# Patient Record
Sex: Female | Born: 2000 | Race: Black or African American | Hispanic: No | Marital: Single | Smoking: Never smoker
Health system: Southern US, Community
[De-identification: ages and names within clinical notes are randomized; demographics above are authoritative.]

## PROBLEM LIST (undated history)

## (undated) DIAGNOSIS — Z789 Other specified health status: Secondary | ICD-10-CM

## (undated) SURGICAL SUPPLY — 53 items
BIT DRILL CANN FLEX 14 (BIT) ×2
BLADE SURG 10 STRL SS (BLADE) ×4
BNDG COHESIVE 4X5 TAN STRL (GAUZE/BANDAGES/DRESSINGS) ×2
BNDG COHESIVE 6X5 TAN STRL LF (GAUZE/BANDAGES/DRESSINGS)
BNDG ELASTIC 4X5.8 VLCR STR LF (GAUZE/BANDAGES/DRESSINGS) ×2
BNDG ELASTIC 6X5.8 VLCR STR LF (GAUZE/BANDAGES/DRESSINGS) ×2
BRUSH SCRUB EZ PLAIN DRY (MISCELLANEOUS) ×4
CHLORAPREP W/TINT 26 (MISCELLANEOUS) ×2
COVER SURGICAL LIGHT HANDLE (MISCELLANEOUS) ×2
COVER WAND RF STERILE (DRAPES) ×2
DERMABOND ADVANCED (GAUZE/BANDAGES/DRESSINGS) ×2
DRAPE C-ARM 35X43 STRL (DRAPES) ×2
DRAPE C-ARMOR (DRAPES) ×2
DRAPE HALF SHEET 40X57 (DRAPES) ×4
DRAPE IMP U-DRAPE 54X76 (DRAPES) ×4
DRAPE INCISE IOBAN 66X45 STRL (DRAPES) ×2
DRAPE ORTHO SPLIT 77X108 STRL (DRAPES) ×4
DRAPE SURG 17X23 STRL (DRAPES) ×2
DRAPE U-SHAPE 47X51 STRL (DRAPES) ×2
DRILL BIT CALIBRATED 4.2 (BIT) ×2
DRILL BIT SHORT 4.2 (BIT) ×4
DRSG MEPILEX BORDER 4X4 (GAUZE/BANDAGES/DRESSINGS) ×4
DRSG MEPILEX BORDER 4X8 (GAUZE/BANDAGES/DRESSINGS) ×2
ELECT REM PT RETURN 9FT ADLT (ELECTROSURGICAL) ×2
GLOVE BIO SURGEON STRL SZ 6.5 (GLOVE) ×3
GLOVE BIO SURGEON STRL SZ7.5 (GLOVE) ×6
GLOVE BIO SURGEONS STRL SZ 6.5 (GLOVE) ×3
GLOVE BIOGEL PI INDICATOR 6.5 (GLOVE) ×2
GLOVE BIOGEL PI INDICATOR 7.5 (GLOVE) ×2
GOWN STRL REUS W/TWL LRG LVL3 (GOWN DISPOSABLE) ×6
GOWN STRL REUS W/TWL XL LVL3 (GOWN DISPOSABLE) ×2
GUIDEWIRE 3.2X400 (WIRE) ×6
KIT BASIN OR (CUSTOM PROCEDURE TRAY) ×2
KIT TURNOVER KIT B (KITS) ×2
MANIFOLD NEPTUNE II (INSTRUMENTS) ×2
NAIL TI CANN 9X380 LEFT (Nail) ×2 IMPLANT
NS IRRIG 1000ML POUR BTL (IV SOLUTION) ×2
PACK GENERAL/GYN (CUSTOM PROCEDURE TRAY) ×2
PAD ARMBOARD 7.5X6 YLW CONV (MISCELLANEOUS) ×4
REAMER ROD DEEP FLUTE 2.5X950 (INSTRUMENTS) ×2
SCREW LOCK STAR 5X38 (Screw) ×4 IMPLANT
SCREW LOCK STAR 5X40 (Screw) ×2 IMPLANT
SCREW LOCK STAR 5X68 (Screw) ×2 IMPLANT
STAPLER VISISTAT 35W (STAPLE) ×2
STOCKINETTE IMPERVIOUS LG (DRAPES) ×2
SUT ETHILON 3 0 PS 1 (SUTURE) ×2
SUT MNCRL AB 3-0 PS2 18 (SUTURE) ×4
SUT VIC AB 0 CT1 27 (SUTURE) ×2
SUT VIC AB 2-0 CT1 27 (SUTURE) ×6
TOWEL GREEN STERILE (TOWEL DISPOSABLE) ×4
TOWEL GREEN STERILE FF (TOWEL DISPOSABLE) ×2
UNDERPAD 30X36 HEAVY ABSORB (UNDERPADS AND DIAPERS) ×2
WATER STERILE IRR 1000ML POUR (IV SOLUTION) ×2

---
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
## 2020-01-10 NOTE — Telephone Encounter (Signed)
Spoke with pt re: no show appt. Pt reports she was in school and was not able to make the appt. Advised pt QW:[id number] policy and upcoming appt on 01/17/20 at 5pm. Pt voiced understanding.

## 2020-01-18 NOTE — Telephone Encounter (Signed)
LVM re: no show appt on 10/21. Advised c this being her 2nd no show appt,,  Her appts can only be scheduled 1 appt in advance. Reminded pt of upcoming appt. On 10/28.

## 2020-03-07 NOTE — Telephone Encounter (Signed)
Pt was called back several times to come in today VM was left Pt having issues with Depo shot

## 2020-03-10 DIAGNOSIS — N926 Irregular menstruation, unspecified: Secondary | ICD-10-CM

## 2020-03-10 NOTE — Progress Notes (Signed)
Established Patient Office Visit  Subjective:  Patient ID: Carol Powell, female    DOB: 30-Jun-2000  Age: 19 y.o. MRN: [id number]  CC:  Chief Complaint  Patient presents with   bleeding w/ depo     HPI Carol Powell presents for follow up. She  has no past medical history on file.   She is having some light spotting since 11/24/2019 injection. She admits that she is concern about injection. She denies any abnormality at the site. She admits that she has received her depo previously in the deltoid. She denies any vaginal irritation. She denies any other symptoms. She had restarted the depo however never had any problems even with the initial depo injection.   She had regular cycles in the past q 28-30 days lasting 7 days with PMS symptoms on day 1& 2 then tapering down.   No past medical history on file.  Past Surgical History:  Procedure Laterality Date   FACIAL LACERATION REPAIR N/A 11/29/2019   Procedure: FACIAL LACERATION REPAIR;  Surgeon: Roby Lofts, MD;  Location: [facility] OR;  Service: Orthopedics;  Laterality: N/A;   FEMUR IM NAIL Left 11/29/2019   Procedure: INTRAMEDULLARY (IM) NAIL FEMORAL;  Surgeon: Roby Lofts, MD;  Location: [facility] OR;  Service: Orthopedics;  Laterality: Left;    Family History  Problem Relation Age of Onset   Hypertension Mother     Social History   Socioeconomic History   Marital status: Single    Spouse name: Not on file   Number of children: Not on file   Years of education: Not on file   Highest education level: Not on file  Occupational History   Not on file  Tobacco Use   Smoking status: Never Smoker   Smokeless tobacco: Never Used  Vaping Use   Vaping Use: Never used  Substance and Sexual Activity   Alcohol use: Never   Drug use: Never   Sexual activity: Not on file  Other Topics Concern   Not on file  Social History Narrative   ** Merged History Encounter **       Social Determinants of Health    Financial Resource Strain: Not on file  Food Insecurity: Not on file  Transportation Needs: Not on file  Physical Activity: Not on file  Stress: Not on file  Social Connections: Not on file  Intimate Partner Violence: Not on file    Outpatient Medications Prior to Visit  Medication Sig Dispense Refill   Cholecalciferol (VITAMIN D3) 50 MCG (2000 UT) capsule Take 1 capsule (2,000 Units total) by mouth daily. 90 capsule 0   docusate sodium (COLACE) 100 MG capsule Take 1 capsule (100 mg total) by mouth 2 (two) times daily. (Patient not taking: Reported on 03/10/2020) 10 capsule 0   methocarbamol (ROBAXIN) 500 MG tablet Take 1 tablet (500 mg total) by mouth every 6 (six) hours as needed for muscle spasms. (Patient not taking: Reported on 03/10/2020) 28 tablet 0   oxyCODONE (OXY IR/ROXICODONE) 5 MG immediate release tablet Take 1 tablet (5 mg total) by mouth every 4 (four) hours as needed for severe pain. (Patient not taking: Reported on 03/10/2020) 42 tablet 0   No facility-administered medications prior to visit.    Allergies  Allergen Reactions   Compazine [Prochlorperazine] Anxiety    Altered mental status   Compazine [Prochlorperazine] Anxiety    ROS Review of Systems    Objective:    Physical Exam Constitutional:      General:  She is not in acute distress.    Appearance: She is not ill-appearing, toxic-appearing or diaphoretic.  HENT:     Head: Normocephalic and atraumatic.     Nose: Nose normal.     Mouth/Throat:     Mouth: Mucous membranes are moist.  Cardiovascular:     Rate and Rhythm: Normal rate and regular rhythm.     Pulses: Normal pulses.     Heart sounds: Normal heart sounds.  Pulmonary:     Effort: Pulmonary effort is normal.     Breath sounds: Normal breath sounds.  Abdominal:     General: Bowel sounds are normal.     Palpations: Abdomen is soft.  Genitourinary:    Comments: deferred Musculoskeletal:        General: Normal range of motion.      Cervical back: Normal range of motion.  Skin:    General: Skin is warm.     Capillary Refill: Capillary refill takes less than 2 seconds.  Neurological:     General: No focal deficit present.     Mental Status: She is alert and oriented to person, place, and time.  Psychiatric:        Mood and Affect: Mood normal.        Behavior: Behavior normal.        Thought Content: Thought content normal.        Judgment: Judgment normal.     BP 133/65 (BP Location: Left Arm, Patient Position: Sitting, Cuff Size: Normal)    Pulse 62    Temp 98 F (36.7 C) (Temporal)    Resp 16    Ht 5' 3.75" (1.619 m)    Wt 144 lb (65.3 kg)    SpO2 100%    BMI 24.91 kg/m  Wt Readings from Last 3 Encounters:  03/10/20 144 lb (65.3 kg) (76 %, Z= 0.70)*  12/05/19 136 lb 11 oz (62 kg) (68 %, Z= 0.46)*  11/29/19 136 lb 3.9 oz (61.8 kg) (67 %, Z= 0.44)*   * Growth percentiles are based on CDC (Girls, 2-20 Years) data.     Health Maintenance Due  Topic Date Due   CHLAMYDIA SCREENING  08/03/2018    There are no preventive care reminders to display for this patient.  No results found for: TSH Lab Results  Component Value Date   WBC 7.4 11/30/2019   HGB 7.8 (L) 11/30/2019   HCT 24.2 (L) 11/30/2019   MCV 87.4 11/30/2019   PLT 243 11/30/2019   Lab Results  Component Value Date   NA 143 11/28/2019   K 3.4 (L) 11/28/2019   CO2 22 11/28/2019   GLUCOSE 114 (H) 11/28/2019   BUN 13 11/28/2019   CREATININE 0.86 11/28/2019   BILITOT 0.6 11/28/2019   ALKPHOS 54 11/28/2019   AST 26 11/28/2019   ALT 16 11/28/2019   PROT 7.7 11/28/2019   ALBUMIN 4.1 11/28/2019   CALCIUM 9.5 11/28/2019   ANIONGAP 8 11/28/2019   No results found for: CHOL No results found for: HDL No results found for: LDLCALC No results found for: TRIG No results found for: CHOLHDL No results found for: ZOXW9U    Assessment & Plan:   Problem List Items Addressed This Visit   None   Visit Diagnoses    Irregular bleeding     -  Primary Long discussion on birthcontrol and hormone levels. Compliance    Relevant Orders   POCT urine pregnancy (Completed)   Chlamydia/Gonococcus/Trichomonas, NAA (Completed)  Meds ordered this encounter  Medications   Norgestimate-Ethinyl Estradiol Triphasic (ORTHO TRI-CYCLEN LO) 0.18/0.215/0.25 MG-25 MCG tab    Sig: Take 1 tablet by mouth daily.    Dispense:  28 tablet    Refill:  11    Order Specific Question:   Supervising Provider    Answer:   Quentin Angst [[id number]]    Follow-up: Return today (on 03/10/2020) for follow up as needed.    Barbette Merino, NP

## 2020-03-10 NOTE — Patient Instructions (Signed)
Contraception Choices °Contraception, also called birth control, means things to use or ways to try not to get pregnant. °Hormonal birth control °This kind of birth control uses hormones. Here are some types of hormonal birth control: °· A tube that is put under skin of the arm (implant). The tube can stay in for as long as 3 years. °· Shots to get every 3 months (injections). °· Pills to take every day (birth control pills). °· A patch to change 1 time each week for 3 weeks (birth control patch). After that, the patch is taken off for 1 week. °· A ring to put in the vagina. The ring is left in for 3 weeks. Then it is taken out of the vagina for 1 week. Then a new ring is put in. °· Pills to take after unprotected sex (emergency birth control pills). °Barrier birth control °Here are some types of barrier birth control: °· A thin covering that is put on the penis before sex (female condom). The covering is thrown away after sex. °· A soft, loose covering that is put in the vagina before sex (female condom). The covering is thrown away after sex. °· A rubber bowl that sits over the cervix (diaphragm). The bowl must be made for you. The bowl is put into the vagina before sex. The bowl is left in for 6-8 hours after sex. It is taken out within 24 hours. °· A small, soft cup that fits over the cervix (cervical cap). The cup must be made for you. The cup can be left in for 6-8 hours after sex. It is taken out within 48 hours. °· A sponge that is put into the vagina before sex. It must be left in for at least 6 hours after sex. It must be taken out within 30 hours. Then it is thrown away. °· A chemical that kills or stops sperm from getting into the uterus (spermicide). It may be a pill, cream, jelly, or foam to put in the vagina. The chemical should be used at least 10-15 minutes before sex. °IUD (intrauterine) birth control °An IUD is a small, T-shaped piece of plastic. It is put inside the uterus. There are two  kinds: °· Hormone IUD. This kind can stay in for 3-5 years. °· Copper IUD. This kind can stay in for 10 years. °Permanent birth control °Here are some types of permanent birth control: °· Surgery to block the fallopian tubes. °· Having an insert put into each fallopian tube. °· Surgery to tie off the tubes that carry sperm (vasectomy). °Natural planning birth control °Here are some types of natural planning birth control: °· Not having sex on the days the woman could get pregnant. °· Using a calendar: °? To keep track of the length of each period. °? To find out what days pregnancy can happen. °? To plan to not have sex on days when pregnancy can happen. °· Watching for symptoms of ovulation and not having sex during ovulation. One way the woman can check for ovulation is to check her temperature. °· Waiting to have sex until after ovulation. °Summary °· Contraception, also called birth control, means things to use or ways to try not to get pregnant. °· Hormonal methods of birth control include implants, injections, pills, patches, vaginal rings, and emergency birth control pills. °· Barrier methods of birth control can include female condoms, female condoms, diaphragms, cervical caps, sponges, and spermicides. °· There are two types of IUD (intrauterine device) birth control.   An IUD can be put in a woman's uterus to prevent pregnancy for 3-5 years.  Permanent sterilization can be done through a procedure for males, females, or both.  Natural planning methods involve not having sex on the days when the woman could get pregnant. This information is not intended to replace advice given to you by your health care provider. Make sure you discuss any questions you have with your health care provider. Document Revised: 07/05/2018 Document Reviewed: 03/25/2016   Oral Contraception Information Oral contraceptive pills (OCPs) are medicines taken to prevent pregnancy. OCPs are taken by mouth, and they work  by:  Preventing the ovaries from releasing eggs.  Thickening mucus in the lower part of the uterus (cervix), which prevents sperm from entering the uterus.  Thinning the lining of the uterus (endometrium), which prevents a fertilized egg from attaching to the endometrium. OCPs are highly effective when taken exactly as prescribed. However, OCPs do not prevent STIs (sexually transmitted infections). Safe sex practices, such as using condoms while on an OCP, can help prevent STIs. Before starting OCPs Before you start taking OCPs, you may have a physical exam, blood test, and Pap test. However, you are not required to have a pelvic exam in order to be prescribed OCPs. Your health care provider will make sure you are a good candidate for oral contraception. OCPs are not a good option for certain women, including women who smoke and are older than 35 years, and women with a medical history of high blood pressure, deep vein thrombosis, pulmonary embolism, stroke, cardiovascular disease, or peripheral vascular disease. Discuss with your health care provider the possible side effects of the OCP you may be prescribed. When you start an OCP, be aware that it can take 2-3 months for your body to adjust to changes in hormone levels. Follow instructions from your health care provider about how to start taking your first cycle of OCPs. Depending on when you start the pill, you may need to use a backup form of birth control, such as condoms, during the first week. Make sure you know what steps to take if you ever forget to take the pill. Types of oral contraception  The most common types of birth control pills contain the hormones estrogen and progestin (synthetic progesterone) or progestin only. The combination pill This type of pill contains estrogen and progestin hormones. Combination pills often come in packs of 21, 28, or 91 pills. For each pack, the last 7 pills may not contain hormones, which means you may  stop taking the pills for 7 days. Menstrual bleeding occurs during the week that you do not take the pills or that you take the pills with no hormones in them. The minipill This type of pill contains the progestin hormone only. It comes in packs of 28 pills. All 28 pills contain the hormone. You take the pill every day. It is very important to take the pill at the same time each day. Advantages of oral contraceptive pills  Provides reliable and continuous contraception if taken as instructed.  May treat or decrease symptoms of: ? Menstrual period cramps. ? Irregular menstrual cycle or bleeding. ? Heavy menstrual flow. ? Abnormal uterine bleeding. ? Acne, depending on the type of pill. ? Polycystic ovarian syndrome. ? Endometriosis. ? Iron deficiency anemia. ? Premenstrual symptoms, including premenstrual dysphoric disorder.  May reduce the risk of endometrial and ovarian cancer.  Can be used as emergency contraception.  Prevents mislocated (ectopic) pregnancies and infections of  the fallopian tubes. Things that can make oral contraceptive pills less effective OCPs can be less effective if:  You forget to take the pill at the same time every day. This is especially important when taking the minipill.  You have a stomach or intestinal disease that reduces your body's ability to absorb the pill.  You take OCPs with other medicines that make OCPs less effective, such as antibiotics, certain HIV medicines, and some seizure medicines.  You take expired OCPs.  You forget to restart the pill on day 7, if using the packs of 21 pills. Risks associated with oral contraceptive pills Oral contraceptive pills can sometimes cause side effects, such as:  Headache.  Depression.  Trouble sleeping.  Nausea and vomiting.  Breast tenderness.  Irregular bleeding or spotting during the first several months.  Bloating or fluid retention.  Increase in blood pressure. Combination pills are  also associated with a small increase in the risk of:  Blood clots.  Heart attack.  Stroke. Summary  Oral contraceptive pills are medicines taken by mouth to prevent pregnancy. They are highly effective when taken exactly as prescribed.  The most common types of birth control pills contain the hormones estrogen and progestin (synthetic progesterone) or progestin only.  Before you start taking the pill, you may have a physical exam, blood test, and Pap test. Your health care provider will make sure you are a good candidate for oral contraception.  The combination pill may come in a 21-day pack, a 28-day pack, or a 91-day pack. The minipill contains the progesterone hormone only and comes in packs of 28 pills.  Oral contraceptive pills can sometimes cause side effects, such as headache, nausea, breast tenderness, or irregular bleeding. This information is not intended to replace advice given to you by your health care provider. Make sure you discuss any questions you have with your health care provider. Document Revised: 02/25/2017 Document Reviewed: 06/08/2016 Elsevier Patient Education  2020 [employer].  Elsevier Patient Education  [employer].

## 2020-03-10 NOTE — Progress Notes (Signed)
Bleeding/spotting since last depo injection in August

## 2021-06-01 DIAGNOSIS — Z130 Encounter for screening for diseases of the blood and blood-forming organs and certain disorders involving the immune mechanism: Secondary | ICD-10-CM

## 2021-06-01 DIAGNOSIS — Z0000 Encounter for general adult medical examination without abnormal findings: Secondary | ICD-10-CM | POA: Diagnosis not present

## 2021-06-01 DIAGNOSIS — N898 Other specified noninflammatory disorders of vagina: Secondary | ICD-10-CM

## 2021-06-01 NOTE — Progress Notes (Signed)
? ?[facility] ?[address] ?[place], [place]  [address] ?Phone:  [phone]   Fax:  [phone] ? ? ?Acute Office Visit ? ?Subjective:  ? ? Patient ID: Carol Powell, female    DOB: 01-24-2001, 20 y.o.   MRN: [id number] ? ?Chief Complaint  ?Patient presents with  ? OFFICE VISIT  ?  Pt stated she has white discharge with odor.  ? ? ?HPI ?Patient is in today for follow up. She  has no past medical history on file.  ? ?Vaginitis ?Patient presents for evaluation of an abnormal vaginal discharge. Symptoms have been present for 2 days. Vaginal symptoms: odor. Contraception: none. She denies abnormal bleeding, blisters, dyspareunia, lesions, local irritation, pain, post coital bleeding, tears, vulvar itching, and warts. Sexually transmitted infection risk: very low risk of STD exposure. Menstrual flow: regular every 28-30 days. Her cycles last 6-7 days.  ? ?History reviewed. No pertinent past medical history. ? ?Past Surgical History:  ?Procedure Laterality Date  ? FACIAL LACERATION REPAIR N/A 11/29/2019  ? Procedure: FACIAL LACERATION REPAIR;  Surgeon: Roby Lofts, MD;  Location: [facility] OR;  Service: Orthopedics;  Laterality: N/A;  ? FEMUR IM NAIL Left 11/29/2019  ? Procedure: INTRAMEDULLARY (IM) NAIL FEMORAL;  Surgeon: Roby Lofts, MD;  Location: [facility] OR;  Service: Orthopedics;  Laterality: Left;  ? ? ?Family History  ?Problem Relation Age of Onset  ? Hypertension Mother   ? ? ?Social History  ? ?Socioeconomic History  ? Marital status: Single  ?  Spouse name: Not on file  ? Number of children: Not on file  ? Years of education: Not on file  ? Highest education level: Not on file  ?Occupational History  ? Not on file  ?Tobacco Use  ? Smoking status: Never  ? Smokeless tobacco: Never  ?Vaping Use  ? Vaping Use: Never used  ?Substance and Sexual Activity  ? Alcohol use: Never  ? Drug use: Never  ? Sexual activity: Not on file  ?Other Topics Concern  ? Not on file  ?Social History Narrative  ? **  Merged History Encounter **  ?    ? ?Social Determinants of Health  ? ?Financial Resource Strain: Not on file  ?Food Insecurity: Not on file  ?Transportation Needs: Not on file  ?Physical Activity: Not on file  ?Stress: Not on file  ?Social Connections: Not on file  ?Intimate Partner Violence: Not on file  ? ? ?Outpatient Medications Prior to Visit  ?Medication Sig Dispense Refill  ? Cholecalciferol (VITAMIN D3) 50 MCG (2000 UT) capsule Take 1 capsule (2,000 Units total) by mouth daily. 90 capsule 0  ? Norgestimate-Ethinyl Estradiol Triphasic (ORTHO TRI-CYCLEN LO) 0.18/0.215/0.25 MG-25 MCG tab Take 1 tablet by mouth daily. 28 tablet 11  ? docusate sodium (COLACE) 100 MG capsule Take 1 capsule (100 mg total) by mouth 2 (two) times daily. (Patient not taking: Reported on 03/10/2020) 10 capsule 0  ? methocarbamol (ROBAXIN) 500 MG tablet Take 1 tablet (500 mg total) by mouth every 6 (six) hours as needed for muscle spasms. (Patient not taking: Reported on 03/10/2020) 28 tablet 0  ? oxyCODONE (OXY IR/ROXICODONE) 5 MG immediate release tablet Take 1 tablet (5 mg total) by mouth every 4 (four) hours as needed for severe pain. (Patient not taking: Reported on 03/10/2020) 42 tablet 0  ? ?No facility-administered medications prior to visit.  ? ? ?Allergies  ?Allergen Reactions  ? Compazine [Prochlorperazine] Anxiety  ?  Altered mental status  ?  Compazine [Prochlorperazine] Anxiety  ? ? ?Review of Systems ? ?   ?Objective:  ?  ?Physical Exam ?HENT:  ?   Head: Normocephalic.  ?   Mouth/Throat:  ?   Mouth: Mucous membranes are moist.  ?Cardiovascular:  ?   Rate and Rhythm: Normal rate.  ?   Pulses: Normal pulses.  ?Musculoskeletal:  ?   Cervical back: Normal range of motion.  ?Skin: ?   General: Skin is warm.  ?   Capillary Refill: Capillary refill takes less than 2 seconds.  ?Neurological:  ?   Mental Status: She is alert.  ? ? ?BP 121/67   Pulse 60   Temp 98 ?F (36.7 ?C)   Ht 5' 3.75" (1.619 m)   Wt 153 lb (69.4 kg)    SpO2 100%   BMI 26.47 kg/m?  ?Wt Readings from Last 3 Encounters:  ?06/01/21 153 lb (69.4 kg)  ?03/10/20 144 lb (65.3 kg) (76 %, Z= 0.70)*  ?12/05/19 136 lb 11 oz (62 kg) (68 %, Z= 0.46)*  ? ?* Growth percentiles are based on CDC (Girls, 2-20 Years) data.  ? ? ?Health Maintenance Due  ?Topic Date Due  ? HPV VACCINES (1 - 2-dose series) Never done  ? ? ?   ?Topic Date Due  ? HPV VACCINES (1 - 2-dose series) Never done  ? ? ? ?No results found for: TSH ?Lab Results  ?Component Value Date  ? WBC 7.4 11/30/2019  ? HGB 7.8 (L) 11/30/2019  ? HCT 24.2 (L) 11/30/2019  ? MCV 87.4 11/30/2019  ? PLT 243 11/30/2019  ? ?Lab Results  ?Component Value Date  ? NA 143 11/28/2019  ? K 3.4 (L) 11/28/2019  ? CO2 22 11/28/2019  ? GLUCOSE 114 (H) 11/28/2019  ? BUN 13 11/28/2019  ? CREATININE 0.86 11/28/2019  ? BILITOT 0.6 11/28/2019  ? ALKPHOS 54 11/28/2019  ? AST 26 11/28/2019  ? ALT 16 11/28/2019  ? PROT 7.7 11/28/2019  ? ALBUMIN 4.1 11/28/2019  ? CALCIUM 9.5 11/28/2019  ? ANIONGAP 8 11/28/2019  ? ?No results found for: CHOL ?No results found for: HDL ?No results found for: LDLCALC ?No results found for: TRIG ?No results found for: CHOLHDL ?No results found for: HGBA1C ? ?   ?Assessment & Plan:  ? ?Problem List Items Addressed This Visit   ?None ?Visit Diagnoses   ? ? Health care maintenance    -  Primary  ? Relevant Orders  ? POCT URINALYSIS DIP (CLINITEK) (Completed)  ? Vaginal discharge     ?Empiric tx with diflucan due to discharge  ? Relevant Orders  ? NuSwab Vaginitis Plus (VG+)  ? Screening for iron deficiency anemia      ? Relevant Orders  ? CBC with Differential/Platelet  ? Iron, TIBC and Ferritin Panel  ? ?  ? ? ? ?Meds ordered this encounter  ?Medications  ? fluconazole (DIFLUCAN) 150 MG tablet  ?  Sig: Take 1 tablet (150 mg total) by mouth daily.  ?  Dispense:  1 tablet  ?  Refill:  0  ?  Order Specific Question:   Supervising Provider  ?  AnswerQuentin Angst [[id number]]  ? ? ? ?Barbette Merino, NP ? ?

## 2021-06-01 NOTE — Patient Instructions (Addendum)
Anemia °Anemia is a condition in which there is not enough red blood cells or hemoglobin in the blood. Hemoglobin is a substance in red blood cells that carries oxygen. °When you do not have enough red blood cells or hemoglobin (are anemic), your body cannot get enough oxygen and your organs may not work properly. As a result, you may feel very tired or have other problems. °What are the causes? °Common causes of anemia include: °Excessive bleeding. Anemia can be caused by excessive bleeding inside or outside the body, including bleeding from the intestines or from heavy menstrual periods in females. °Poor nutrition. °Long-lasting (chronic) kidney, thyroid, and liver disease. °Bone marrow disorders, spleen problems, and blood disorders. °Cancer and treatments for cancer. °HIV (human immunodeficiency virus) and AIDS (acquired immunodeficiency syndrome). °Infections, medicines, and autoimmune disorders that destroy red blood cells. °What are the signs or symptoms? °Symptoms of this condition include: °Minor weakness. °Dizziness. °Headache, or difficulties concentrating and sleeping. °Heartbeats that feel irregular or faster than normal (palpitations). °Shortness of breath, especially with exercise. °Pale skin, lips, and nails, or cold hands and feet. °Indigestion and nausea. °Symptoms may occur suddenly or develop slowly. If your anemia is mild, you may not have symptoms. °How is this diagnosed? °This condition is diagnosed based on blood tests, your medical history, and a physical exam. In some cases, a test may be needed in which cells are removed from the soft tissue inside of a bone and looked at under a microscope (bone marrow biopsy). Your health care provider may also check your stool (feces) for blood and may do additional testing to look for the cause of your bleeding. °Other tests may include: °Imaging tests, such as a CT scan or MRI. °A procedure to see inside your esophagus and stomach (endoscopy). °A  procedure to see inside your colon and rectum (colonoscopy). °How is this treated? °Treatment for this condition depends on the cause. If you continue to lose a lot of blood, you may need to be treated at a hospital. Treatment may include: °Taking supplements of iron, vitamin B12, or folic acid. °Taking a hormone medicine (erythropoietin) that can help to stimulate red blood cell growth. °Having a blood transfusion. This may be needed if you lose a lot of blood. °Making changes to your diet. °Having surgery to remove your spleen. °Follow these instructions at home: °Take over-the-counter and prescription medicines only as told by your health care provider. °Take supplements only as told by your health care provider. °Follow any diet instructions that you were given by your health care provider. °Keep all follow-up visits as told by your health care provider. This is important. °Contact a health care provider if: °You develop new bleeding anywhere in the body. °Get help right away if: °You are very weak. °You are short of breath. °You have pain in your abdomen or chest. °You are dizzy or feel faint. °You have trouble concentrating. °You have bloody stools, black stools, or tarry stools. °You vomit repeatedly or you vomit up blood. °These symptoms may represent a serious problem that is an emergency. Do not wait to see if the symptoms will go away. Get medical help right away. Call your local emergency services (911 in the [place] Do not drive yourself to the hospital. °Summary °Anemia is a condition in which you do not have enough red blood cells or enough of a substance in your red blood cells that carries oxygen (hemoglobin). °Symptoms may occur suddenly or develop slowly. °If your anemia is   mild, you may not have symptoms. This condition is diagnosed with blood tests, a medical history, and a physical exam. Other tests may be needed. Treatment for this condition depends on the cause of the anemia. This  information is not intended to replace advice given to you by your health care provider. Make sure you discuss any questions you have with your health care provider. Document Revised: 02/20/2019 Document Reviewed: 02/20/2019 Elsevier Patient Education  2022 [place] 21-1 Years Old, Female Preventive care refers to lifestyle choices and visits with your health care provider that can promote health and wellness. Preventive care visits are also called wellness exams. What can I expect for my preventive care visit? Counseling During your preventive care visit, your health care provider may ask about your: Medical history, including: Past medical problems. Family medical history. Pregnancy history. Current health, including: Menstrual cycle. Method of birth control. Emotional well-being. Home life and relationship well-being. Sexual activity and sexual health. Lifestyle, including: Alcohol, nicotine or tobacco, and drug use. Access to firearms. Diet, exercise, and sleep habits. Work and work Statistician. Sunscreen use. Safety issues such as seatbelt and bike helmet use. Physical exam Your health care provider may check your: Height and weight. These may be used to calculate your BMI (body mass index). BMI is a measurement that tells if you are at a healthy weight. Waist circumference. This measures the distance around your waistline. This measurement also tells if you are at a healthy weight and may help predict your risk of certain diseases, such as type 2 diabetes and high blood pressure. Heart rate and blood pressure. Body temperature. Skin for abnormal spots. What immunizations do I need? Vaccines are usually given at various ages, according to a schedule. Your health care provider will recommend vaccines for you based on your age, medical history, and lifestyle or other factors, such as travel or where you work. What tests do I need? Screening Your health  care provider may recommend screening tests for certain conditions. This may include: Pelvic exam and Pap test. Lipid and cholesterol levels. Diabetes screening. This is done by checking your blood sugar (glucose) after you have not eaten for a while (fasting). Hepatitis B test. Hepatitis C test. HIV (human immunodeficiency virus) test. STI (sexually transmitted infection) testing, if you are at risk. BRCA-related cancer screening. This may be done if you have a family history of breast, ovarian, tubal, or peritoneal cancers. Talk with your health care provider about your test results, treatment options, and if necessary, the need for more tests. Follow these instructions at home: Eating and drinking  Eat a healthy diet that includes fresh fruits and vegetables, whole grains, lean protein, and low-fat dairy products. Take vitamin and mineral supplements as recommended by your health care provider. Do not drink alcohol if: Your health care provider tells you not to drink. You are pregnant, may be pregnant, or are planning to become pregnant. If you drink alcohol: Limit how much you have to 0-1 drink a day. Know how much alcohol is in your drink. In the [place] one drink equals one 12 oz bottle of beer (355 mL), one 5 oz glass of wine (148 mL), or one 1 oz glass of hard liquor (44 mL). Lifestyle Brush your teeth every morning and night with fluoride toothpaste. Floss one time each day. Exercise for at least 30 minutes 5 or more days each week. Do not use any products that contain nicotine or tobacco. These products include cigarettes,  chewing tobacco, and vaping devices, such as e-cigarettes. If you need help quitting, ask your health care provider. Do not use drugs. If you are sexually active, practice safe sex. Use a condom or other form of protection to prevent STIs. If you do not wish to become pregnant, use a form of birth control. If you plan to become pregnant, see your health care  provider for a prepregnancy visit. Find healthy ways to manage stress, such as: Meditation, yoga, or listening to music. Journaling. Talking to a trusted person. Spending time with friends and family. Minimize exposure to UV radiation to reduce your risk of skin cancer. Safety Always wear your seat belt while driving or riding in a vehicle. Do not drive: If you have been drinking alcohol. Do not ride with someone who has been drinking. If you have been using any mind-altering substances or drugs. While texting. When you are tired or distracted. Wear a helmet and other protective equipment during sports activities. If you have firearms in your house, make sure you follow all gun safety procedures. Seek help if you have been physically or sexually abused. What's next? Go to your health care provider once a year for an annual wellness visit. Ask your health care provider how often you should have your eyes and teeth checked. Stay up to date on all vaccines. This information is not intended to replace advice given to you by your health care provider. Make sure you discuss any questions you have with your health care provider. Document Revised: 09/10/2020 Document Reviewed: 09/10/2020 Elsevier Patient Education  [place].

## 2021-08-31 DIAGNOSIS — R109 Unspecified abdominal pain: Secondary | ICD-10-CM

## 2021-08-31 DIAGNOSIS — Z3A01 Less than 8 weeks gestation of pregnancy: Secondary | ICD-10-CM

## 2021-08-31 NOTE — Patient Instructions (Signed)
You were seen today in the [facility] for confirmation of pregnancy. Labs were collected, results will be available via MyChart or, if abnormal, you will be contacted by clinic staff.  Please follow up in the upcoming weeks with Ob/gyn

## 2021-09-01 DIAGNOSIS — Z331 Pregnant state, incidental: Secondary | ICD-10-CM | POA: Insufficient documentation

## 2021-09-01 DIAGNOSIS — Z5321 Procedure and treatment not carried out due to patient leaving prior to being seen by health care provider: Secondary | ICD-10-CM | POA: Insufficient documentation

## 2021-09-01 NOTE — ED Triage Notes (Signed)
Pt presents with c/o pregnancy. Pt reports she was told yesterday that she was pregnant and was advised to come here for an ultrasound as it may be a while before she is able to see her OB. Pt's LMP approx 07/22/21.

## 2021-09-01 NOTE — ED Provider Triage Note (Signed)
Emergency Medicine Provider Triage Evaluation Note  Carol Powell , a 21 y.o. female  was evaluated in triage.  Pt complains of pregnancy.  Pt reports her provider advised her to come here for an ultrasound  Review of Systems  Positive: Negative:   Physical Exam  BP 126/67 (BP Location: Left Arm)   Pulse 71   Temp 98.4 F (36.9 C) (Oral)   Resp 16   LMP 07/22/2021 (Approximate)   SpO2 99%  Gen:   Awake, no distress   Resp:  Normal effort  MSK:   Moves extremities without difficulty  Other:    Medical Decision Making  Medically screening exam initiated at 12:50 PM.  Appropriate orders placed.  Nea Gittens was informed that the remainder of the evaluation will be completed by another provider, this initial triage assessment does not replace that evaluation, and the importance of remaining in the ED until their evaluation is complete.     Elson Areas, [place] 09/01/21 1251

## 2021-09-02 NOTE — ED Notes (Signed)
Pt was to be discharged out of system yesterday due to not answering upon name call and not visualized in lobby.

## 2021-09-14 DIAGNOSIS — Z3A01 Less than 8 weeks gestation of pregnancy: Secondary | ICD-10-CM | POA: Diagnosis not present

## 2021-09-14 DIAGNOSIS — O98311 Other infections with a predominantly sexual mode of transmission complicating pregnancy, first trimester: Secondary | ICD-10-CM | POA: Insufficient documentation

## 2021-09-14 DIAGNOSIS — A5901 Trichomonal vulvovaginitis: Secondary | ICD-10-CM | POA: Insufficient documentation

## 2021-09-14 DIAGNOSIS — O219 Vomiting of pregnancy, unspecified: Secondary | ICD-10-CM | POA: Insufficient documentation

## 2021-09-14 DIAGNOSIS — E860 Dehydration: Secondary | ICD-10-CM

## 2021-09-14 DIAGNOSIS — R112 Nausea with vomiting, unspecified: Secondary | ICD-10-CM

## 2021-09-14 DIAGNOSIS — A599 Trichomoniasis, unspecified: Secondary | ICD-10-CM

## 2021-09-14 DIAGNOSIS — Z3491 Encounter for supervision of normal pregnancy, unspecified, first trimester: Secondary | ICD-10-CM

## 2021-09-14 NOTE — Patient Instructions (Signed)
Carol Powell, thank you for joining Carol Loveless, PA-C for today's virtual visit.  While this provider is not your primary care provider (PCP), if your PCP is located in our provider database this encounter information will be shared with them immediately following your visit.  Consent: (Patient) Carol Powell provided verbal consent for this virtual visit at the beginning of the encounter.  Current Medications:  Current Outpatient Medications:    docusate sodium (COLACE) 100 MG capsule, Take 1 capsule (100 mg total) by mouth 2 (two) times daily. (Patient not taking: Reported on 03/10/2020), Disp: 10 capsule, Rfl: 0   fluconazole (DIFLUCAN) 150 MG tablet, Take 1 tablet (150 mg total) by mouth daily. (Patient not taking: Reported on 08/31/2021), Disp: 1 tablet, Rfl: 0   Norgestimate-Ethinyl Estradiol Triphasic (ORTHO TRI-CYCLEN LO) 0.18/0.215/0.25 MG-25 MCG tab, Take 1 tablet by mouth daily. (Patient not taking: Reported on 08/31/2021), Disp: 28 tablet, Rfl: 11   Medications ordered in this encounter:  No orders of the defined types were placed in this encounter.    *If you need refills on other medications prior to your next appointment, please contact your pharmacy*  Follow-Up: Call back or seek an in-person evaluation if the symptoms worsen or if the condition fails to improve as anticipated.  Other Instructions First Trimester of Pregnancy  The first trimester of pregnancy starts on the first day of your last menstrual period until the end of week 12. This is months 1 through 3 of pregnancy. A week after a sperm fertilizes an egg, the egg will implant into the wall of the uterus and begin to develop into a baby. By the end of 12 weeks, all the baby's organs will be formed and the baby will be 2-3 inches in size. Body changes during your first trimester Your body goes through many changes during pregnancy. The changes vary and generally return to normal after your baby is  born. Physical changes You may gain or lose weight. Your breasts may begin to grow larger and become tender. The tissue that surrounds your nipples (areola) may become darker. Dark spots or blotches (chloasma or mask of pregnancy) may develop on your face. You may have changes in your hair. These can include thickening or thinning of your hair or changes in texture. Health changes You may feel nauseous, and you may vomit. You may have heartburn. You may develop headaches. You may develop constipation. Your gums may bleed and may be sensitive to brushing and flossing. Other changes You may tire easily. You may urinate more often. Your menstrual periods will stop. You may have a loss of appetite. You may develop cravings for certain kinds of food. You may have changes in your emotions from day to day. You may have more vivid and strange dreams. Follow these instructions at home: Medicines Follow your health care provider's instructions regarding medicine use. Specific medicines may be either safe or unsafe to take during pregnancy. Do not take any medicines unless told to by your health care provider. Take a prenatal vitamin that contains at least 600 micrograms (mcg) of folic acid. Eating and drinking Eat a healthy diet that includes fresh fruits and vegetables, whole grains, good sources of protein such as meat, eggs, or tofu, and low-fat dairy products. Avoid raw meat and unpasteurized juice, milk, and cheese. These carry germs that can harm you and your baby. If you feel nauseous or you vomit: Eat 4 or 5 small meals a day instead of 3 large meals.  Try eating a few soda crackers. Drink liquids between meals instead of during meals. You may need to take these actions to prevent or treat constipation: Drink enough fluid to keep your urine pale yellow. Eat foods that are high in fiber, such as beans, whole grains, and fresh fruits and vegetables. Limit foods that are high in fat and  processed sugars, such as fried or sweet foods. Activity Exercise only as directed by your health care provider. Most people can continue their usual exercise routine during pregnancy. Try to exercise for 30 minutes at least 5 days a week. Stop exercising if you develop pain or cramping in the lower abdomen or lower back. Avoid exercising if it is very hot or humid or if you are at high altitude. Avoid heavy lifting. If you choose to, you may have sex unless your health care provider tells you not to. Relieving pain and discomfort Wear a good support bra to relieve breast tenderness. Rest with your legs elevated if you have leg cramps or low back pain. If you develop bulging veins (varicose veins) in your legs: Wear support hose as told by your health care provider. Elevate your feet for 15 minutes, 3-4 times a day. Limit salt in your diet. Safety Wear your seat belt at all times when driving or riding in a car. Talk with your health care provider if someone is verbally or physically abusive to you. Talk with your health care provider if you are feeling sad or have thoughts of hurting yourself. Lifestyle Do not use hot tubs, steam rooms, or saunas. Do not douche. Do not use tampons or scented sanitary pads. Do not use herbal remedies, alcohol, illegal drugs, or medicines that are not approved by your health care provider. Chemicals in these products can harm your baby. Do not use any products that contain nicotine or tobacco, such as cigarettes, e-cigarettes, and chewing tobacco. If you need help quitting, ask your health care provider. Avoid cat litter boxes and soil used by cats. These carry germs that can cause birth defects in the baby and possibly loss of the unborn baby (fetus) by miscarriage or stillbirth. General instructions During routine prenatal visits in the first trimester, your health care provider will do a physical exam, perform necessary tests, and ask you how things are  going. Keep all follow-up visits. This is important. Ask for help if you have counseling or nutritional needs during pregnancy. Your health care provider can offer advice or refer you to specialists for help with various needs. Schedule a dentist appointment. At home, brush your teeth with a soft toothbrush. Floss gently. Write down your questions. Take them to your prenatal visits. Where to find more information American Pregnancy Association: americanpregnancy.org [employer]: https://www.todd-brady.net/ Office on [employer] Health: MightyReward.co.nz Contact a health care provider if you have: Dizziness. A fever. Mild pelvic cramps, pelvic pressure, or nagging pain in the abdominal area. Nausea, vomiting, or diarrhea that lasts for 24 hours or longer. A bad-smelling vaginal discharge. Pain when you urinate. Known exposure to a contagious illness, such as chickenpox, measles, Zika virus, HIV, or hepatitis. Get help right away if you have: Spotting or bleeding from your vagina. Severe abdominal cramping or pain. Shortness of breath or chest pain. Any kind of trauma, such as from a fall or a car crash. New or increased pain, swelling, or redness in an arm or leg. Summary The first trimester of pregnancy starts on the first day of your last  menstrual period until the end of week 12 (months 1 through 3). Eating 4 or 5 small meals a day rather than 3 large meals may help to relieve nausea and vomiting. Do not use any products that contain nicotine or tobacco, such as cigarettes, e-cigarettes, and chewing tobacco. If you need help quitting, ask your health care provider. Keep all follow-up visits. This is important. This information is not intended to replace advice given to you by your health care provider. Make sure you discuss any questions you have with your health care provider. Document Revised: 08/22/2019 Document Reviewed:  06/28/2019 Elsevier Patient Education  2023 Elsevier Inc.    If you have been instructed to have an in-person evaluation today at a local Urgent Care facility, please use the link below. It will take you to a list of all of our available [facility], including address, phone number and hours of operation. Please do not delay care.  [facility]  If you or a family member do not have a primary care provider, use the link below to schedule a visit and establish care. When you choose a [facility] primary care physician or advanced practice provider, you gain a long-term partner in health. Find a Primary Care Provider  Learn more about [facility]'s in-office and virtual care options: [facility] - Get Care Now

## 2021-09-14 NOTE — MAU Note (Signed)
..  Carol Powell is a 21 y.o. at [redacted]w[redacted]d here in MAU reporting: not able to keep anything down. Has vomited twice today. Has not taken any medication besides prenatal vitamins. Denies abdominal pain. Reports she saw dark brown when she wipes.  LMP: April 26 Onset of complaint: saturday Pain score: 0/10 Vitals:   09/14/21 2214  BP: 123/66  Pulse: 71  Resp: 15  Temp: 98.6 F (37 C)  SpO2: 98%      Lab orders placed from triage: UA

## 2021-09-14 NOTE — Progress Notes (Signed)
Patient is [redacted] weeks pregnant and reports she has been unable to maintain food or liquids for 3 days. Reports even water she will vomit back up. Reports she has urinated recently and it was really dark in color. Advised patient to seek care at a local ER or at MAU for evaluation for nausea, vomiting, and dehydration in 1st trimester pregnancy.

## 2021-09-15 DIAGNOSIS — Z3A01 Less than 8 weeks gestation of pregnancy: Secondary | ICD-10-CM

## 2021-09-15 DIAGNOSIS — O219 Vomiting of pregnancy, unspecified: Secondary | ICD-10-CM

## 2021-09-15 NOTE — MAU Provider Note (Signed)
Chief Complaint:  Emesis   Event Date/Time   First Provider Initiated Contact with Patient 09/15/21 0043     HPI: Carol Powell is a 21 y.o. G1P0 at [redacted]w[redacted]d who presents to maternity admissions reporting nausea and vomiting with inability to keep anything down for the past 4 days with two episodes of emesis today (has only tried to eat/drink anything twice today. Also having some brown discharge but no abdominal pain. Denies vaginal bleeding, or discharge. No other physical complaints.  Pregnancy Course: Being seen by [facility] but has not established prenatal care anywhere else yet - planning to go to [facility] per pt.  History reviewed. No pertinent past medical history. OB History  Gravida Para Term Preterm AB Living  1            SAB IAB Ectopic Multiple Live Births               # Outcome Date GA Lbr Len/2nd Weight Sex Delivery Anes PTL Lv  1 Current            Past Surgical History:  Procedure Laterality Date   FACIAL LACERATION REPAIR N/A 11/29/2019   Procedure: FACIAL LACERATION REPAIR;  Surgeon: Roby Lofts, MD;  Location: [facility] OR;  Service: Orthopedics;  Laterality: N/A;   FEMUR IM NAIL Left 11/29/2019   Procedure: INTRAMEDULLARY (IM) NAIL FEMORAL;  Surgeon: Roby Lofts, MD;  Location: [facility] OR;  Service: Orthopedics;  Laterality: Left;   Family History  Problem Relation Age of Onset   Hypertension Mother    Social History   Tobacco Use   Smoking status: Never   Smokeless tobacco: Never  Vaping Use   Vaping Use: Never used  Substance Use Topics   Alcohol use: Never   Drug use: Never   Allergies  Allergen Reactions   Compazine [Prochlorperazine] Anxiety    Altered mental status   Compazine [Prochlorperazine] Anxiety   No medications prior to admission.   I have reviewed patient's Past Medical Hx, Surgical Hx, Family Hx, Social Hx, medications and allergies.   ROS:  Pertinent items noted in HPI and remainder of comprehensive ROS otherwise negative.    Physical Exam  Patient Vitals for the past 24 hrs:  BP Temp Temp src Pulse Resp SpO2 Height Weight  09/15/21 0242 (!) 109/50 -- -- 68 18 -- -- --  09/14/21 2214 123/66 98.6 F (37 C) Oral 71 15 98 % 5\' 3"  (1.6 m) 150 lb 14.4 oz (68.4 kg)   Constitutional: Well-developed, well-nourished female in no acute distress.  Cardiovascular: normal rate & rhythm Respiratory: normal effort GI: Abd soft, non-tender MS: Extremities nontender, no edema, normal ROM Neurologic: Alert and oriented x 4.  GU: no CVA tenderness Pelvic: self-swabs obtained, pelvic exam deferred  Labs: Results for orders placed or performed during the hospital encounter of 09/14/21 (from the past 24 hour(s))  Urinalysis, Routine w reflex microscopic Urine, Clean Catch     Status: Abnormal   Collection Time: 09/14/21 10:22 PM  Result Value Ref Range   Color, Urine YELLOW YELLOW   APPearance HAZY (A) CLEAR   Specific Gravity, Urine 1.024 1.005 - 1.030   pH 5.0 5.0 - 8.0   Glucose, UA NEGATIVE NEGATIVE mg/dL   Hgb urine dipstick MODERATE (A) NEGATIVE   Bilirubin Urine NEGATIVE NEGATIVE   Ketones, ur 80 (A) NEGATIVE mg/dL   Protein, ur 30 (A) NEGATIVE mg/dL   Nitrite NEGATIVE NEGATIVE   Leukocytes,Ua SMALL (A) NEGATIVE   RBC /  HPF 6-10 0 - 5 RBC/hpf   WBC, UA 11-20 0 - 5 WBC/hpf   Bacteria, UA FEW (A) NONE SEEN   Squamous Epithelial / LPF 11-20 0 - 5  Wet prep, genital     Status: Abnormal   Collection Time: 09/14/21 11:57 PM  Result Value Ref Range   Yeast Wet Prep HPF POC NONE SEEN NONE SEEN   Trich, Wet Prep PRESENT (A) NONE SEEN   Clue Cells Wet Prep HPF POC NONE SEEN NONE SEEN   WBC, Wet Prep HPF POC <10 <10   Sperm NONE SEEN   hCG, quantitative, pregnancy     Status: Abnormal   Collection Time: 09/14/21 11:57 PM  Result Value Ref Range   hCG, Beta Chain, Quant, S >250,000 (H) <5 mIU/mL  ABO/Rh     Status: None   Collection Time: 09/14/21 11:57 PM  Result Value Ref Range   ABO/RH(D) O POS    No rh  immune globuloin      NOT A RH IMMUNE GLOBULIN CANDIDATE, PT RH POSITIVE Performed at [facility], [address], [place], [place] [address]   CBC     Status: Abnormal   Collection Time: 09/14/21 11:57 PM  Result Value Ref Range   WBC 7.2 4.0 - 10.5 K/uL   RBC 4.50 3.87 - 5.11 MIL/uL   Hemoglobin 14.7 12.0 - 15.0 g/dL   HCT 60.6 30.1 - 60.1 %   MCV 87.8 80.0 - 100.0 fL   MCH 32.7 26.0 - 34.0 pg   MCHC 37.2 (H) 30.0 - 36.0 g/dL   RDW 09.3 23.5 - 57.3 %   Platelets 330 150 - 400 K/uL   nRBC 0.0 0.0 - 0.2 %   Imaging:  Pt informed that the ultrasound is considered a limited OB ultrasound and is not intended to be a complete ultrasound exam.  Patient also informed that the ultrasound is not being completed with the intent of assessing for fetal or placental anomalies or any pelvic abnormalities.  Explained that the purpose of today's ultrasound is to assess for  fetal heart rate and viability.  Patient acknowledges the purpose of the exam and the limitations of the study. Fetal heart rate noted with appropriate rate per Dr. Shawnie Pons.  MAU Course: Orders Placed This Encounter  Procedures   Wet prep, genital   Urinalysis, Routine w reflex microscopic Urine, Clean Catch   hCG, quantitative, pregnancy   CBC   ABO/Rh   Discharge patient   Meds ordered this encounter  Medications   DISCONTD: lactated ringers bolus 1,000 mL   DISCONTD: ondansetron (ZOFRAN) injection 4 mg   lactated ringers bolus 1,000 mL   ondansetron (ZOFRAN) injection 4 mg   famotidine (PEPCID) IVPB 20 mg premix   Doxylamine-Pyridoxine (DICLEGIS) 10-10 MG TBEC    Sig: Take 1-2 tablets by mouth at bedtime.    Dispense:  60 tablet    Refill:  3    Order Specific Question:   Supervising Provider    Answer:   Reva Bores [[id number]]   ondansetron (ZOFRAN-ODT) 4 MG disintegrating tablet    Sig: Take 1 tablet (4 mg total) by mouth every 8 (eight) hours as needed for nausea or vomiting. Take before antibiotic dose.     Dispense:  5 tablet    Refill:  0    Order Specific Question:   Supervising Provider    Answer:   Reva Bores [[id number]]   metroNIDAZOLE (FLAGYL) 500 MG tablet  Sig: Take 4 tablets (2,000 mg total) by mouth once for 1 dose.    Dispense:  4 tablet    Refill:  0    Order Specific Question:   Supervising Provider    Answer:   Merrily Pew   MDM: LR bolus with zofran and pepcid given with almost complete relief of nausea, pt declined po challenge (or subsequent med dose for trichomonas).   Ectopic workup showed IUP with normal labs, along with trichomonas. Pt informed of pregnancy viability, brown discharge likely from cervicitis/vaginitis due to trichomonas. Pt advised to take zofran ODT tomorrow, eat, then take flagyl dose. Pt expressed understanding.  Assessment: 1. Nausea and vomiting during pregnancy   2. Normal pregnancy in first trimester   3. Trichomonas vaginalis infection   4. [redacted] weeks gestation of pregnancy    Plan: Discharge home in stable condition with return precautions     Follow-up Information     [facility] Follow up.   Why: as scheduled for ongoing prenatal care Contact information: [address]  [address] [place] [address] [phone]                 Allergies as of 09/15/2021       Reactions   Compazine [prochlorperazine] Anxiety   Altered mental status   Compazine [prochlorperazine] Anxiety        Medication List     STOP taking these medications    docusate sodium 100 MG capsule Commonly known as: COLACE   Norgestimate-Ethinyl Estradiol Triphasic 0.18/0.215/0.25 MG-25 MCG tab Commonly known as: Ortho Tri-Cyclen Lo       TAKE these medications    Doxylamine-Pyridoxine 10-10 MG Tbec Commonly known as: Diclegis Take 1-2 tablets by mouth at bedtime.   fluconazole 150 MG tablet Commonly known as: Diflucan Take 1 tablet (150 mg total) by mouth daily.   metroNIDAZOLE 500 MG tablet Commonly  known as: Flagyl Take 4 tablets (2,000 mg total) by mouth once for 1 dose.   ondansetron 4 MG disintegrating tablet Commonly known as: ZOFRAN-ODT Take 1 tablet (4 mg total) by mouth every 8 (eight) hours as needed for nausea or vomiting. Take before antibiotic dose.       Gaylan Gerold, CNM, MSN, [place] Certified Nurse Midwife, [facility]

## 2021-09-18 DIAGNOSIS — Z3A08 8 weeks gestation of pregnancy: Secondary | ICD-10-CM

## 2021-09-18 DIAGNOSIS — R1110 Vomiting, unspecified: Secondary | ICD-10-CM | POA: Diagnosis not present

## 2021-09-18 DIAGNOSIS — O210 Mild hyperemesis gravidarum: Secondary | ICD-10-CM | POA: Insufficient documentation

## 2021-09-18 HISTORY — DX: Other specified health status: Z78.9

## 2021-09-20 DIAGNOSIS — Z5321 Procedure and treatment not carried out due to patient leaving prior to being seen by health care provider: Secondary | ICD-10-CM | POA: Insufficient documentation

## 2021-09-22 DIAGNOSIS — O210 Mild hyperemesis gravidarum: Secondary | ICD-10-CM | POA: Diagnosis not present

## 2021-09-22 DIAGNOSIS — O211 Hyperemesis gravidarum with metabolic disturbance: Secondary | ICD-10-CM

## 2021-09-22 DIAGNOSIS — T50905A Adverse effect of unspecified drugs, medicaments and biological substances, initial encounter: Secondary | ICD-10-CM

## 2021-09-22 DIAGNOSIS — O26891 Other specified pregnancy related conditions, first trimester: Secondary | ICD-10-CM | POA: Diagnosis present

## 2021-09-22 DIAGNOSIS — Z3A08 8 weeks gestation of pregnancy: Secondary | ICD-10-CM

## 2021-09-22 DIAGNOSIS — Z79899 Other long term (current) drug therapy: Secondary | ICD-10-CM | POA: Insufficient documentation

## 2021-09-22 NOTE — MAU Provider Note (Addendum)
Chief Complaint: Nausea and Abdominal Pain   Event Date/Time   First Provider Initiated Contact with Patient 09/22/21 1311     SUBJECTIVE HPI: Carol Powell is a 21 y.o. G1P0 at [redacted]w[redacted]d who presents to [facility] by EMS reporting exacerbation of N/V of pregnancy. Reports vomiting 7 times in past 24 hours. Has been prescribed Zofran which partially helps, but ran out and didn't know she had refills. Also Rx'd Scopolamine, Pepcid and Diclegis but she has not picked them up from the pharmacy. Has Scopolamine patch that was placed 4 days ago.   Has had reactions of significant anxiety from Phenergan and Compazine.   Denies fever, chills, diarrhea, sick contacts, VB, abd pain. Plans termination but states it is not due to N/V. Has OB Appt at [facility], 09/23/21.    Past Medical History:  Diagnosis Date   Medical history non-contributory    OB History  Gravida Para Term Preterm AB Living  1            SAB IAB Ectopic Multiple Live Births               # Outcome Date GA Lbr Len/2nd Weight Sex Delivery Anes PTL Lv  1 Current            Past Surgical History:  Procedure Laterality Date   FACIAL LACERATION REPAIR N/A 11/29/2019   Procedure: FACIAL LACERATION REPAIR;  Surgeon: Roby Lofts, MD;  Location: [facility] OR;  Service: Orthopedics;  Laterality: N/A;   FEMUR IM NAIL Left 11/29/2019   Procedure: INTRAMEDULLARY (IM) NAIL FEMORAL;  Surgeon: Roby Lofts, MD;  Location: [facility] OR;  Service: Orthopedics;  Laterality: Left;   Social History   Socioeconomic History   Marital status: Single    Spouse name: Not on file   Number of children: Not on file   Years of education: Not on file   Highest education level: Not on file  Occupational History   Not on file  Tobacco Use   Smoking status: Never   Smokeless tobacco: Never  Vaping Use   Vaping Use: Never used  Substance and Sexual Activity   Alcohol use: Never   Drug use: Never   Sexual activity: Not Currently   Other Topics Concern   Not on file  Social History Narrative   ** Merged History Encounter **       Social Determinants of Health   Financial Resource Strain: Not on file  Food Insecurity: Not on file  Transportation Needs: Not on file  Physical Activity: Not on file  Stress: Not on file  Social Connections: Not on file  Intimate Partner Violence: Not on file   Family History  Problem Relation Age of Onset   Heart disease Mother    Kidney disease Mother    Healthy Father    No current facility-administered medications on file prior to encounter.   Current Outpatient Medications on File Prior to Encounter  Medication Sig Dispense Refill   Doxylamine-Pyridoxine (DICLEGIS) 10-10 MG TBEC Take 1-2 tablets by mouth at bedtime. 60 tablet 3   famotidine (PEPCID) 20 MG tablet Take 1 tablet (20 mg total) by mouth 2 (two) times daily. 60 tablet 1   ondansetron (ZOFRAN) 8 MG tablet Take 1 tablet (8 mg total) by mouth every 8 (eight) hours as needed for nausea or vomiting. 20 tablet 0   Allergies  Allergen Reactions   Reglan [Metoclopramide] Anxiety    Do not give!!!!!   Compazine [  Prochlorperazine] Anxiety    Altered mental status   Compazine [Prochlorperazine] Anxiety   Haldol [Haloperidol] Anxiety    Patient reports feeling anxious.    I have reviewed patient's Past Medical Hx, Surgical Hx, Family Hx, Social Hx, medications and allergies.   Review of Systems  Constitutional:  Negative for appetite change, chills and fever.  Gastrointestinal:  Positive for nausea and vomiting. Negative for abdominal pain, constipation and diarrhea.  Genitourinary:  Negative for dysuria and vaginal bleeding.    OBJECTIVE Patient Vitals for the past 24 hrs:  BP Temp Temp src Pulse Resp SpO2  09/22/21 1639 112/80 -- -- 87 19 99 %  09/22/21 1244 118/70 98.5 F (36.9 C) Oral 65 16 100 %   Constitutional: Well-developed, well-nourished female in mild distress.  HEENT: Mucus membranes  dry Cardiovascular: normal rate Respiratory: normal rate and effort.  GI: Deferred Neurologic: Alert and oriented x 4.  GU: Deferred  LAB RESULTS Results for orders placed or performed during the hospital encounter of 09/22/21 (from the past 24 hour(s))  Urinalysis, Routine w reflex microscopic Urine, Clean Catch     Status: Abnormal   Collection Time: 09/22/21 12:44 PM  Result Value Ref Range   Color, Urine AMBER (A) YELLOW   APPearance HAZY (A) CLEAR   Specific Gravity, Urine 1.025 1.005 - 1.030   pH 5.0 5.0 - 8.0   Glucose, UA NEGATIVE NEGATIVE mg/dL   Hgb urine dipstick NEGATIVE NEGATIVE   Bilirubin Urine SMALL (A) NEGATIVE   Ketones, ur 80 (A) NEGATIVE mg/dL   Protein, ur 967 (A) NEGATIVE mg/dL   Nitrite NEGATIVE NEGATIVE   Leukocytes,Ua NEGATIVE NEGATIVE   RBC / HPF 0-5 0 - 5 RBC/hpf   WBC, UA 11-20 0 - 5 WBC/hpf   Bacteria, UA RARE (A) NONE SEEN   Squamous Epithelial / LPF 6-10 0 - 5   Mucus PRESENT    Hyaline Casts, UA PRESENT    Non Squamous Epithelial 0-5 (A) NONE SEEN    IMAGING No results found.  MAU COURSE Orders Placed This Encounter  Procedures   Urinalysis, Routine w reflex microscopic Urine, Clean Catch   Discharge patient   Meds ordered this encounter  Medications   ondansetron (ZOFRAN) injection 4 mg   lactated ringers bolus 1,000 mL   famotidine (PEPCID) IVPB 20 mg premix   ondansetron (ZOFRAN) injection 4 mg   haloperidol lactate (HALDOL) injection 1 mg   dextrose 5% lactated ringers bolus 1,000 mL   diphenhydrAMINE (BENADRYL) injection 25 mg   scopolamine (TRANSDERM-SCOP) 1 MG/3DAYS 1.5 mg   ondansetron (ZOFRAN-ODT) 8 MG disintegrating tablet    Sig: Take 1 tablet (8 mg total) by mouth every 8 (eight) hours as needed for nausea or vomiting. Take before antibiotic dose.    Dispense:  30 tablet    Refill:  3    Order Specific Question:   Supervising Provider    Answer:   Reva Bores [[id number]]   scopolamine (TRANSDERM-SCOP) 1 MG/3DAYS     Sig: Place 1 patch (1.5 mg total) onto the skin every 3 (three) days.    Dispense:  10 patch    Refill:  12    Order Specific Question:   Supervising Provider    Answer:   Reva Bores [[id number]]   Still having significant nausea after 4 mg Zofran. Second 4mg  dose given.   Still having nausea after second Zofran dose. Very limited options due to previous medication reactions. Pt hasn't vomited while in  MAU but still reporting significant nausea. Tolerating PO liquids. Discussed with Shawnie Pons. Recommend Haldol 1 mg.   Pt reports reaction to Haldol similar to reactions to Phenergan, Reglan and Compazine. Reports feeling very anxious, wants to go home. VSS. No SOB, N/V. Offered Benadryl-declined. No evidence dangerous side effects. Stable for D/C.     Discussed importance of taking all medications as scheduled (Zofran Q8 hours) to keep N/V controlled. Explained that once Sx worsen it is hard to get them back under control. Support person present and aware of recommendation.    MDM - Hyperemesis controlled w/ Zofran. Dehydration treated with 2 L IV fluids. Pt tolerating multiple PO drinks.    - Mild medication reaction. No residual effects.   ASSESSMENT 1. Hyperemesis gravidarum before end of [redacted] week gestation with dehydration   2. Medication reaction, initial encounter   3. [redacted] weeks gestation of pregnancy     PLAN Discharge home in stable condition. Hyperemesis precautions  Follow-up Information     [facility] Follow up on 09/23/2021.   Why: Routine prenatal visit Contact information: [address] [place] [address] [phone]         [facility] Follow up.   Specialty: Obstetrics and Gynecology Why: As needed if symptoms worsen Contact information: [address] [id number] Wilhemina Bonito [place] [address] [phone]               Allergies as of 09/22/2021       Reactions   Reglan [metoclopramide]  Anxiety   Do not give!!!!!   Compazine [prochlorperazine] Anxiety   Altered mental status   Compazine [prochlorperazine] Anxiety   Haldol [haloperidol] Anxiety   Patient reports feeling anxious.        Medication List     TAKE these medications    Doxylamine-Pyridoxine 10-10 MG Tbec Commonly known as: Diclegis Take 1-2 tablets by mouth at bedtime.   famotidine 20 MG tablet Commonly known as: PEPCID Take 1 tablet (20 mg total) by mouth 2 (two) times daily.   ondansetron 8 MG disintegrating tablet Commonly known as: ZOFRAN-ODT Take 1 tablet (8 mg total) by mouth every 8 (eight) hours as needed for nausea or vomiting. Take before antibiotic dose.   ondansetron 8 MG tablet Commonly known as: Zofran Take 1 tablet (8 mg total) by mouth every 8 (eight) hours as needed for nausea or vomiting.   scopolamine 1 MG/3DAYS Commonly known as: TRANSDERM-SCOP Place 1 patch (1.5 mg total) onto the skin every 3 (three) days. Start taking on: September 25, 2021         Katrinka Blazing [place], [place] 09/22/2021  6:58 PM

## 2021-10-03 DIAGNOSIS — O10911 Unspecified pre-existing hypertension complicating pregnancy, first trimester: Secondary | ICD-10-CM | POA: Diagnosis not present

## 2021-10-03 DIAGNOSIS — O10919 Unspecified pre-existing hypertension complicating pregnancy, unspecified trimester: Secondary | ICD-10-CM

## 2021-10-03 DIAGNOSIS — O210 Mild hyperemesis gravidarum: Secondary | ICD-10-CM | POA: Diagnosis not present

## 2021-10-03 DIAGNOSIS — O26891 Other specified pregnancy related conditions, first trimester: Secondary | ICD-10-CM | POA: Diagnosis not present

## 2021-10-03 DIAGNOSIS — Z3A10 10 weeks gestation of pregnancy: Secondary | ICD-10-CM | POA: Diagnosis not present

## 2021-10-03 NOTE — MAU Note (Addendum)
...  Carol Powell is a 21 y.o. at [redacted]w[redacted]d here in MAU via EMS reporting: While at [facility] this morning for her first intake appointment for an abortion she reports her blood pressure was 160/100. She reports they wanted to call an ambulance to be evaluated but reports she informed them she wanted to go home and rest first. She reports she was resting at home and when she stood up she began to feel dizzy so she called EMS. She reports current dizziness. While on the truck she received 400 mL of NS and 4 mg of IV Zofran for nausea. CBG 104 on truck. Denies pain.  Has not eaten anything today or yesterday. She reports she drank Propel yesterday but it "always came back up." Patient has been experiencing N/V her entire pregnancy.  FHT: Informed patient that FHT are performed at 10 weeks. This RN asked the patient her thoughts and desires regarding this as RN did not desire to make the patient uncomfortable and patient reports "I do want to hear it if able to."   176 doppler  Onset of complaint: This morning around 0800 Pain score: Denies pain.  Lab orders placed from triage: UA

## 2021-10-03 NOTE — MAU Provider Note (Signed)
History     CSN: [id number]  Arrival date and time: 10/03/21 1033   Event Date/Time   First Provider Initiated Contact with Patient 10/03/21 1102      Chief Complaint  Patient presents with   Hypertension   Dizziness   Nausea   HPI  Carol Powell is a 21 y.o. G1P0 at [redacted]w[redacted]d who presents via EMS for evaluation of elevated blood pressures. Patient reports she was at an appointment at [facility] and had severe range blood pressures. She reports they wanted her to come be seen. She denies any chest pain, headache or shortness of breath. She denies any pain. She denies any vaginal bleeding, discharge, and leaking of fluid. Denies any constipation, diarrhea or any urinary complaints.  She also reports continuing to struggle with nausea and vomiting daily.    OB History     Gravida  1   Para      Term      Preterm      AB      Living         SAB      IAB      Ectopic      Multiple      Live Births              Past Medical History:  Diagnosis Date   Medical history non-contributory     Past Surgical History:  Procedure Laterality Date   FACIAL LACERATION REPAIR N/A 11/29/2019   Procedure: FACIAL LACERATION REPAIR;  Surgeon: Roby Lofts, MD;  Location: [facility] OR;  Service: Orthopedics;  Laterality: N/A;   FEMUR IM NAIL Left 11/29/2019   Procedure: INTRAMEDULLARY (IM) NAIL FEMORAL;  Surgeon: Roby Lofts, MD;  Location: [facility] OR;  Service: Orthopedics;  Laterality: Left;    Family History  Problem Relation Age of Onset   Heart disease Mother    Kidney disease Mother    Healthy Father     Social History   Tobacco Use   Smoking status: Never   Smokeless tobacco: Never  Vaping Use   Vaping Use: Never used  Substance Use Topics   Alcohol use: Never   Drug use: Never    Allergies:  Allergies  Allergen Reactions   Reglan [Metoclopramide] Anxiety    Do not give!!!!!   Compazine [Prochlorperazine] Anxiety    Altered mental status    Compazine [Prochlorperazine] Anxiety   Haldol [Haloperidol] Anxiety    Patient reports feeling anxious.    Medications Prior to Admission  Medication Sig Dispense Refill Last Dose   famotidine (PEPCID) 20 MG tablet Take 1 tablet (20 mg total) by mouth 2 (two) times daily. 60 tablet 1 10/02/2021   Doxylamine-Pyridoxine (DICLEGIS) 10-10 MG TBEC Take 1-2 tablets by mouth at bedtime. 60 tablet 3    ondansetron (ZOFRAN) 8 MG tablet Take 1 tablet (8 mg total) by mouth every 8 (eight) hours as needed for nausea or vomiting. 20 tablet 0    ondansetron (ZOFRAN-ODT) 8 MG disintegrating tablet Take 1 tablet (8 mg total) by mouth every 8 (eight) hours as needed for nausea or vomiting. Take before antibiotic dose. 30 tablet 3    scopolamine (TRANSDERM-SCOP) 1 MG/3DAYS Place 1 patch (1.5 mg total) onto the skin every 3 (three) days. 10 patch 12     Review of Systems  Constitutional: Negative.  Negative for fatigue and fever.  HENT: Negative.    Respiratory: Negative.  Negative for shortness of breath.   Cardiovascular: Negative.  Negative for chest pain.  Gastrointestinal:  Positive for nausea and vomiting. Negative for abdominal pain, constipation and diarrhea.  Genitourinary: Negative.  Negative for dysuria, vaginal bleeding and vaginal discharge.  Neurological: Negative.  Negative for dizziness and headaches.   Physical Exam   Blood pressure (!) 150/99, pulse 99, temperature 98 F (36.7 C), temperature source Oral, resp. rate 14, last menstrual period 07/22/2021, SpO2 99 %.  Patient Vitals for the past 24 hrs:  BP Temp Temp src Pulse Resp SpO2  10/03/21 1232 138/76 -- -- (!) 102 -- 100 %  10/03/21 1115 (!) 144/97 -- -- 100 -- 100 %  10/03/21 1058 (!) 146/96 -- -- 95 -- 99 %  10/03/21 1049 (!) 150/99 98 F (36.7 C) Oral 99 14 99 %    Physical Exam Vitals and nursing note reviewed.  Constitutional:      General: She is not in acute distress.    Appearance: She is well-developed.  HENT:      Head: Normocephalic.  Eyes:     Pupils: Pupils are equal, round, and reactive to light.  Cardiovascular:     Rate and Rhythm: Normal rate and regular rhythm.     Heart sounds: Normal heart sounds.  Pulmonary:     Effort: Pulmonary effort is normal. No respiratory distress.     Breath sounds: Normal breath sounds.  Abdominal:     General: Bowel sounds are normal. There is no distension.     Palpations: Abdomen is soft.     Tenderness: There is no abdominal tenderness.  Skin:    General: Skin is warm and dry.  Neurological:     Mental Status: She is alert and oriented to person, place, and time.     Motor: No abnormal muscle tone.     Coordination: Coordination normal.     Deep Tendon Reflexes: Reflexes are normal and symmetric. Reflexes normal.  Psychiatric:        Mood and Affect: Mood normal.        Behavior: Behavior normal.        Thought Content: Thought content normal.        Judgment: Judgment normal.     FHT: 176 bpm  MAU Course  Procedures  Results for orders placed or performed during the hospital encounter of 10/03/21 (from the past 24 hour(s))  Urinalysis, Routine w reflex microscopic Urine, Clean Catch     Status: Abnormal   Collection Time: 10/03/21 10:53 AM  Result Value Ref Range   Color, Urine AMBER (A) YELLOW   APPearance HAZY (A) CLEAR   Specific Gravity, Urine 1.019 1.005 - 1.030   pH 5.0 5.0 - 8.0   Glucose, UA NEGATIVE NEGATIVE mg/dL   Hgb urine dipstick NEGATIVE NEGATIVE   Bilirubin Urine SMALL (A) NEGATIVE   Ketones, ur 80 (A) NEGATIVE mg/dL   Protein, ur 119 (A) NEGATIVE mg/dL   Nitrite NEGATIVE NEGATIVE   Leukocytes,Ua NEGATIVE NEGATIVE   RBC / HPF 0-5 0 - 5 RBC/hpf   WBC, UA 0-5 0 - 5 WBC/hpf   Bacteria, UA NONE SEEN NONE SEEN   Squamous Epithelial / LPF 0-5 0 - 5   Mucus PRESENT     MDM Labs ordered and reviewed.   UA LR bolus Zofran IV  Discussed with patient starting PO antihypertensives now that she meets criteria for CHTN in  pregnancy. Patient agreeable to plan of care.   Assessment and Plan   1. Chronic hypertension affecting pregnancy   2.  [redacted] weeks gestation of pregnancy   3. Hyperemesis arising during pregnancy     -Discharge home in stable condition -Rx for labetalol sent to patient's pharmacy -First trimester precautions discussed -Patient advised to follow-up with OB as scheduled for prenatal care -Patient may return to MAU as needed or if her condition were to change or worsen  Rolm Bookbinder, CNM 10/03/2021, 11:02 AM

## 2021-10-03 NOTE — MAU Note (Signed)
Patient would like to try some sprite now. CNM notified. Sprite given.
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
## 2021-11-19 IMAGING — DX DG FEMUR 2+V PORT*L*
1 series · 4 of 4 positions shown · non-contrast
Comparison: 11/28/2019

CLINICAL DATA: Postoperative, left femur fracture repair

EXAM:
LEFT FEMUR PORTABLE 2 VIEWS

[Series 1: femur · 0.14mm/px · 4 of 4 slices shown]
[im 1/4]
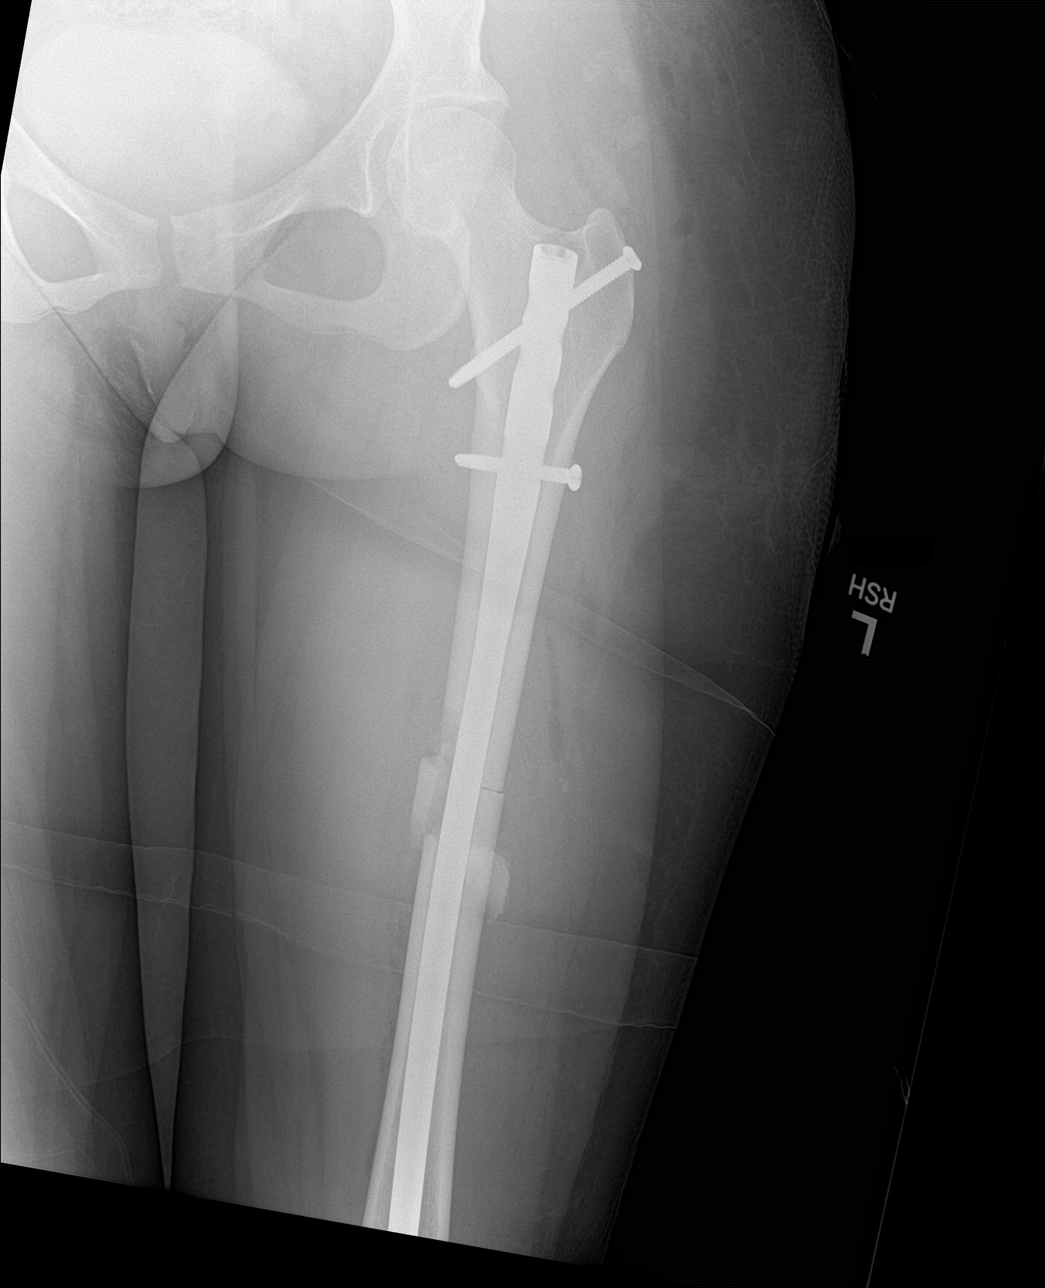
[im 2/4]
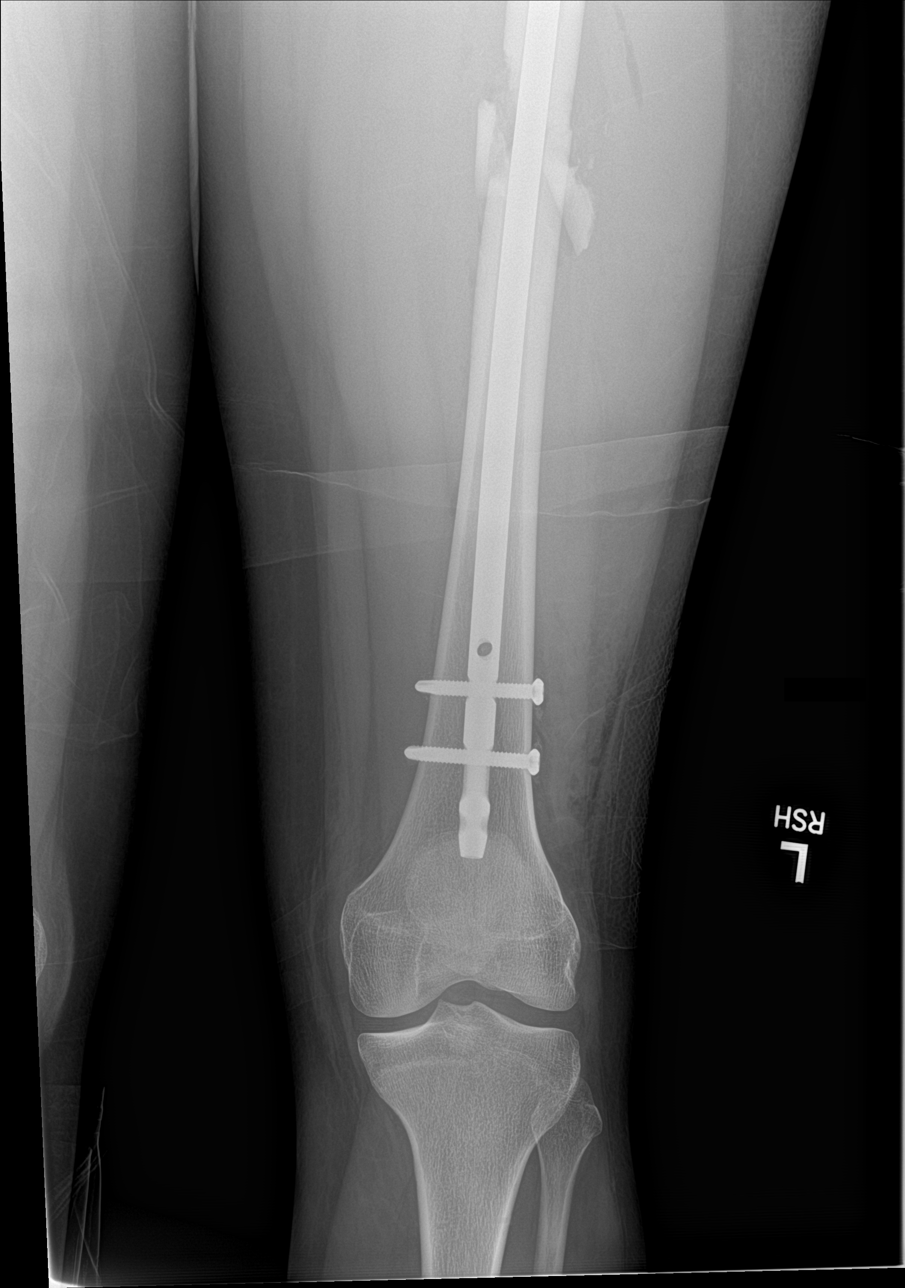
[im 3/4]
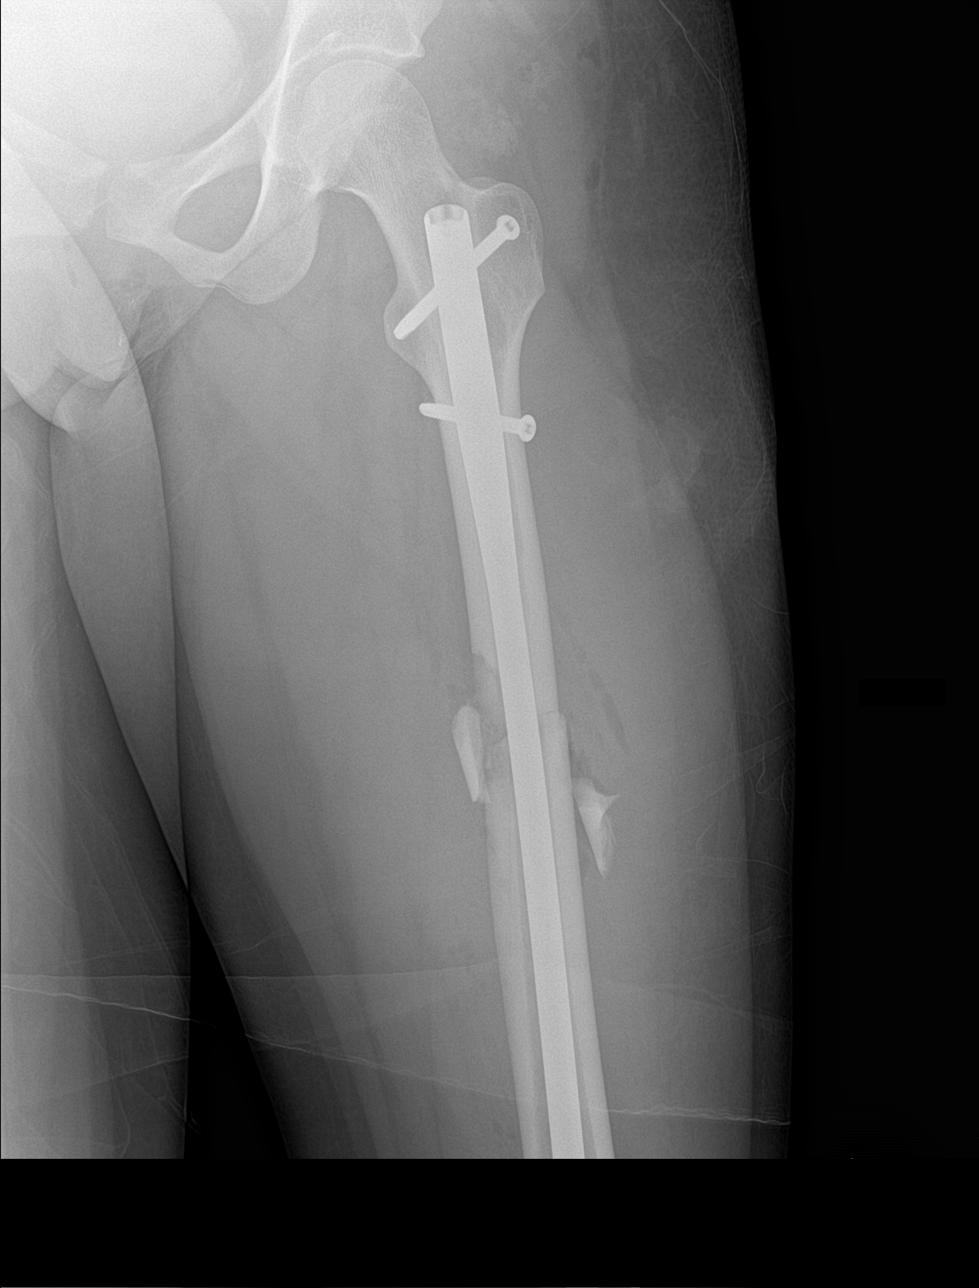
[im 4/4]
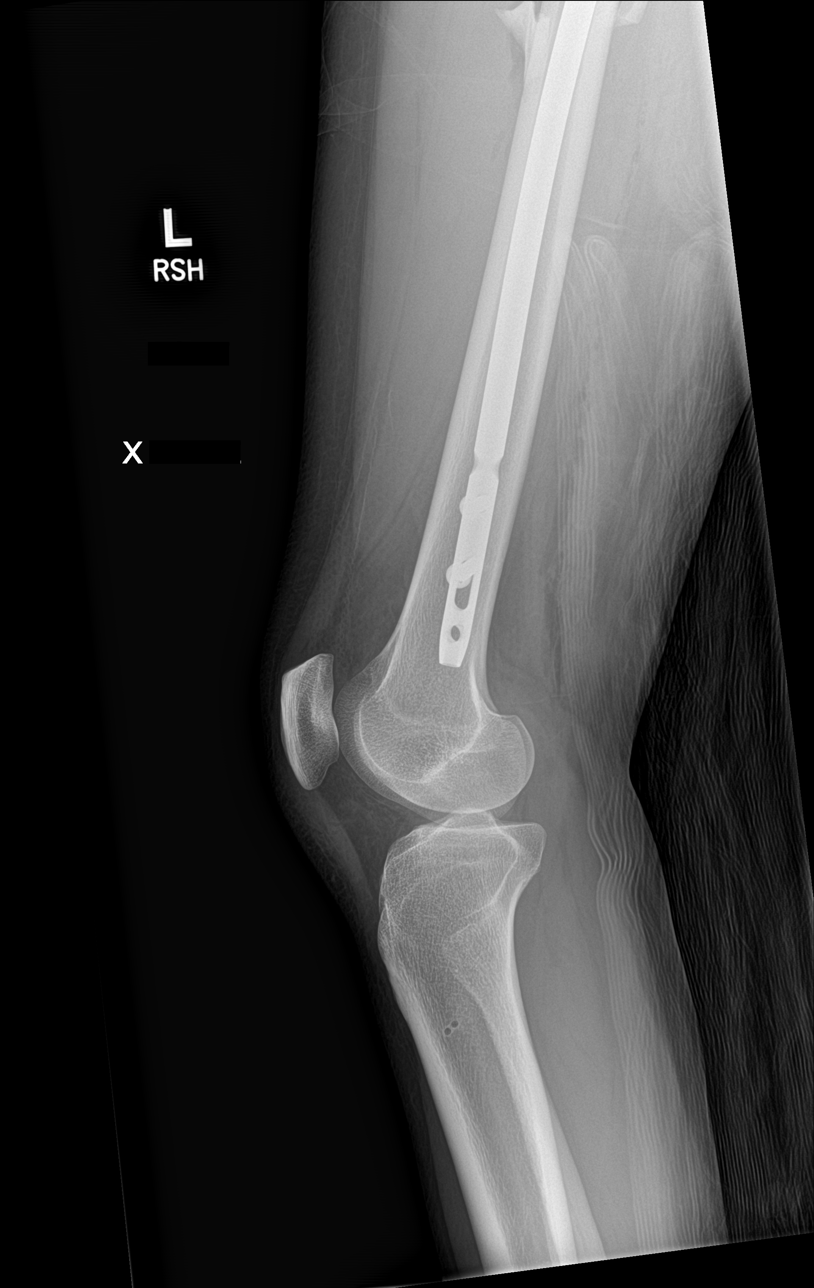

[4 of 4 positions shown; findings below may reference images not displayed]

FINDINGS: Status post intramedullary nail fixation of comminuted fractures of
the mid left femoral diaphysis, with overall anatomic alignment of
the diaphysis but with persistently displaced comminuted fracture
fragments. No evidence of perihardware fracture or malpositioning.
Overlying soft tissue edema.
IMPRESSION: Status post intramedullary nail fixation of comminuted fractures of
the mid left femoral diaphysis, with overall anatomic alignment of
the diaphysis but with persistently displaced comminuted fracture
fragments.
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
## 2022-03-18 DIAGNOSIS — O479 False labor, unspecified: Secondary | ICD-10-CM

## 2022-03-18 DIAGNOSIS — I10 Essential (primary) hypertension: Secondary | ICD-10-CM | POA: Diagnosis present

## 2022-03-18 DIAGNOSIS — O0933 Supervision of pregnancy with insufficient antenatal care, third trimester: Secondary | ICD-10-CM

## 2022-03-18 DIAGNOSIS — Z3493 Encounter for supervision of normal pregnancy, unspecified, third trimester: Secondary | ICD-10-CM

## 2022-03-18 DIAGNOSIS — O26899 Other specified pregnancy related conditions, unspecified trimester: Secondary | ICD-10-CM | POA: Diagnosis present

## 2022-03-18 DIAGNOSIS — Z3A34 34 weeks gestation of pregnancy: Secondary | ICD-10-CM

## 2022-03-18 DIAGNOSIS — Z3689 Encounter for other specified antenatal screening: Secondary | ICD-10-CM

## 2022-03-18 NOTE — ED Notes (Signed)
TRN paged OB rapid response nurse.
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
## 2022-03-19 DIAGNOSIS — O26899 Other specified pregnancy related conditions, unspecified trimester: Secondary | ICD-10-CM | POA: Diagnosis present

## 2022-03-19 DIAGNOSIS — I10 Essential (primary) hypertension: Secondary | ICD-10-CM | POA: Diagnosis present

## 2022-03-19 DIAGNOSIS — O0933 Supervision of pregnancy with insufficient antenatal care, third trimester: Secondary | ICD-10-CM
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
